# Patient Record
Sex: Male | Born: 1980
Health system: Southern US, Community
[De-identification: ages and names within clinical notes are randomized; demographics above are authoritative.]

---

## 2009-05-16 ENCOUNTER — Ambulatory Visit: Payer: Self-pay | Admitting: Internal Medicine

## 2009-05-25 ENCOUNTER — Encounter: Admission: RE | Admit: 2009-05-25 | Discharge: 2009-05-25 | Payer: Self-pay | Admitting: Internal Medicine

## 2012-09-08 ENCOUNTER — Other Ambulatory Visit: Payer: Self-pay | Admitting: Family Medicine

## 2012-09-08 DIAGNOSIS — IMO0002 Reserved for concepts with insufficient information to code with codable children: Secondary | ICD-10-CM

## 2012-09-08 DIAGNOSIS — G8929 Other chronic pain: Secondary | ICD-10-CM

## 2012-09-15 ENCOUNTER — Ambulatory Visit
Admission: RE | Admit: 2012-09-15 | Discharge: 2012-09-15 | Disposition: A | Discharge status: Home / Self Care | Payer: BC Managed Care – PPO | Source: Ambulatory Visit | Attending: Family Medicine | Admitting: Family Medicine

## 2012-09-15 DIAGNOSIS — IMO0002 Reserved for concepts with insufficient information to code with codable children: Secondary | ICD-10-CM

## 2012-09-15 DIAGNOSIS — G8929 Other chronic pain: Secondary | ICD-10-CM

## 2012-09-15 MED ORDER — GADOBENATE DIMEGLUMINE 529 MG/ML IV SOLN
17.0000 mL | Freq: Once | INTRAVENOUS | Status: AC | PRN
  Administered 2012-09-15: 17 mL via INTRAVENOUS

## 2013-09-26 ENCOUNTER — Ambulatory Visit
Admission: RE | Admit: 2013-09-26 | Discharge: 2013-09-26 | Disposition: A | Discharge status: Home / Self Care | Payer: BC Managed Care – PPO | Source: Ambulatory Visit | Attending: Family Medicine | Admitting: Family Medicine

## 2013-09-26 ENCOUNTER — Other Ambulatory Visit: Payer: Self-pay | Admitting: Family Medicine

## 2013-09-26 DIAGNOSIS — M545 Low back pain, unspecified: Secondary | ICD-10-CM

## 2015-04-18 IMAGING — CR DG LUMBAR SPINE COMPLETE 4+V
5 series · 5 of 5 positions shown · non-contrast
Comparison: None.

CLINICAL DATA: Chronic low back pain without radiculopathy for
years. No acute injury.

EXAM:
LUMBAR SPINE - COMPLETE 4+ VIEW

[view not recorded (1 of 5)]
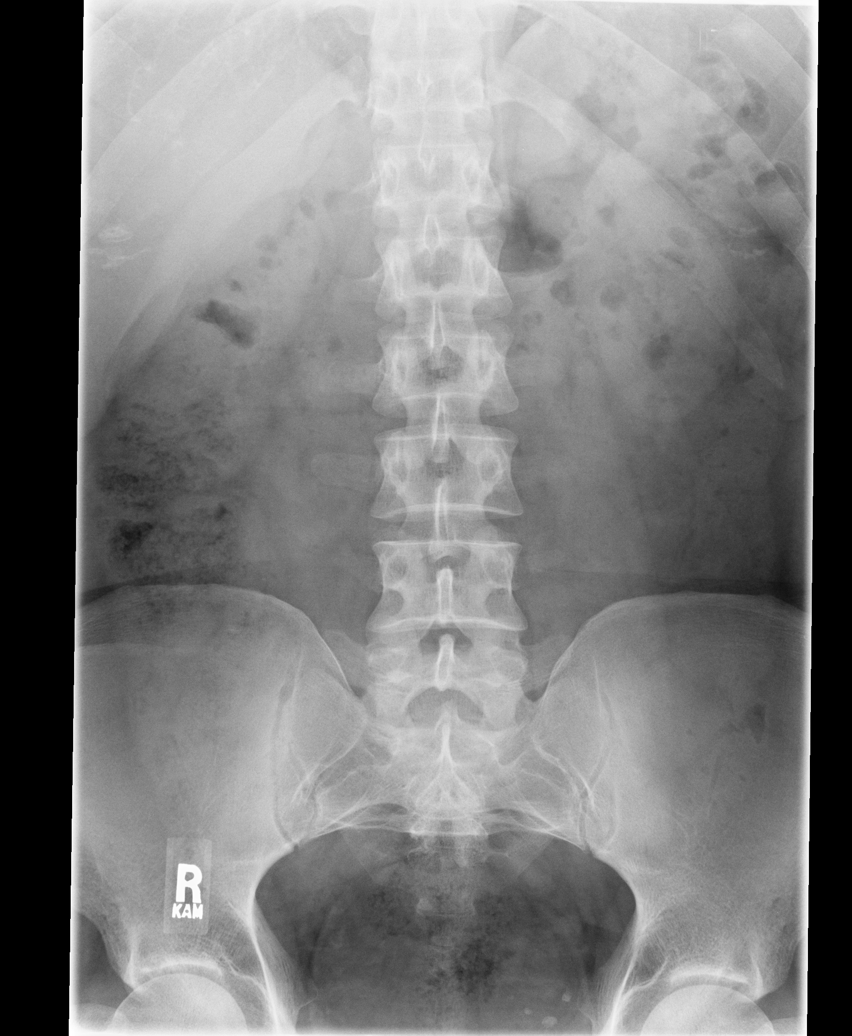

[view not recorded (2 of 5)]
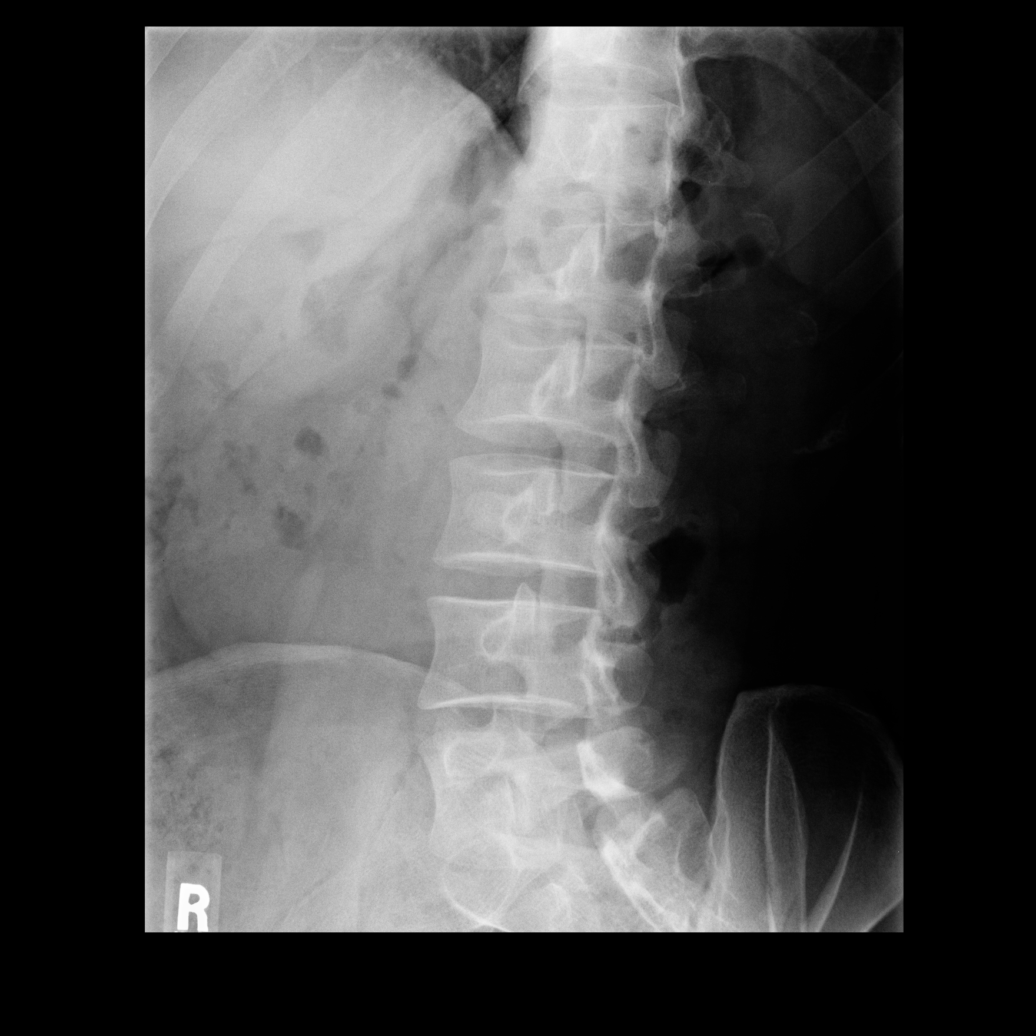

[view not recorded (3 of 5)]
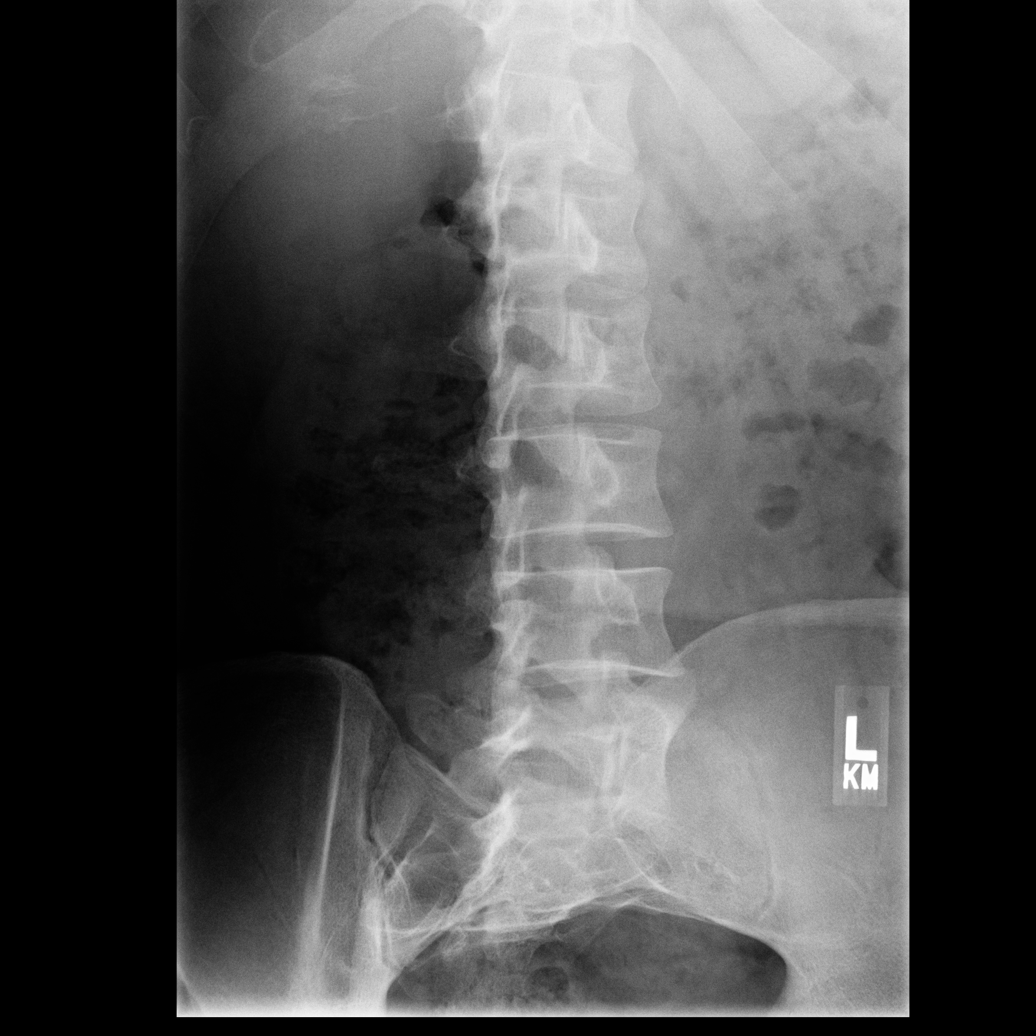

[view not recorded (4 of 5)]
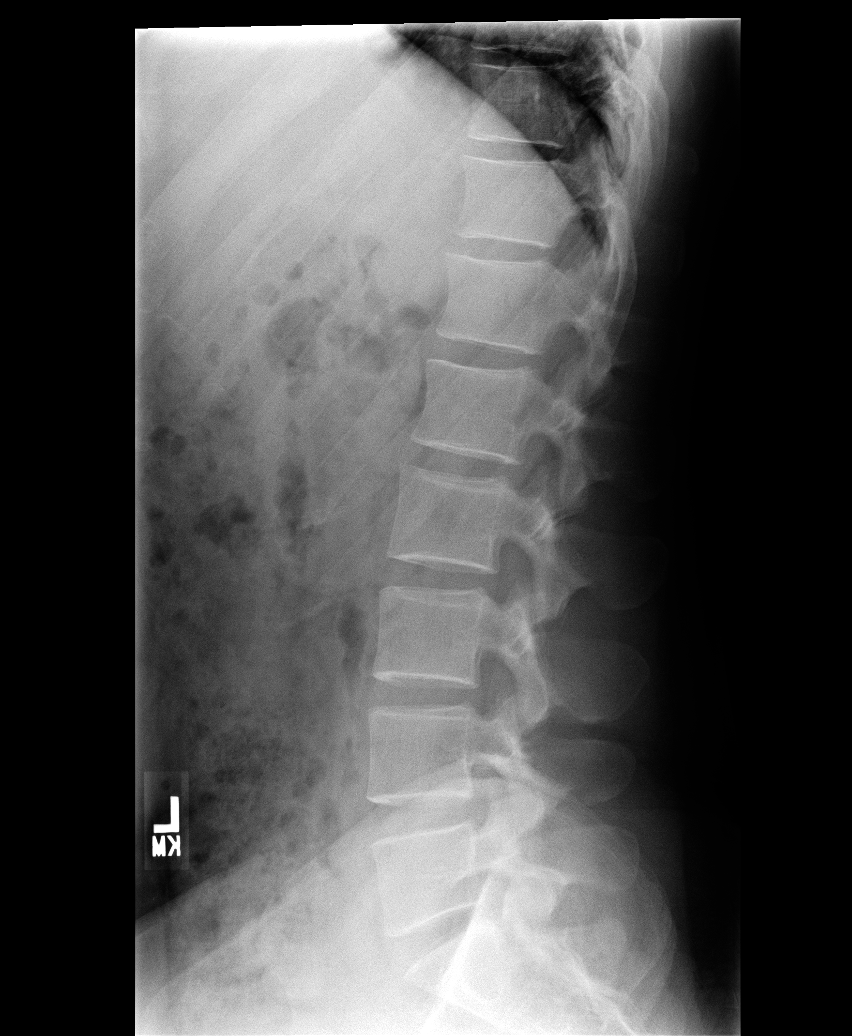

[view not recorded (5 of 5)]
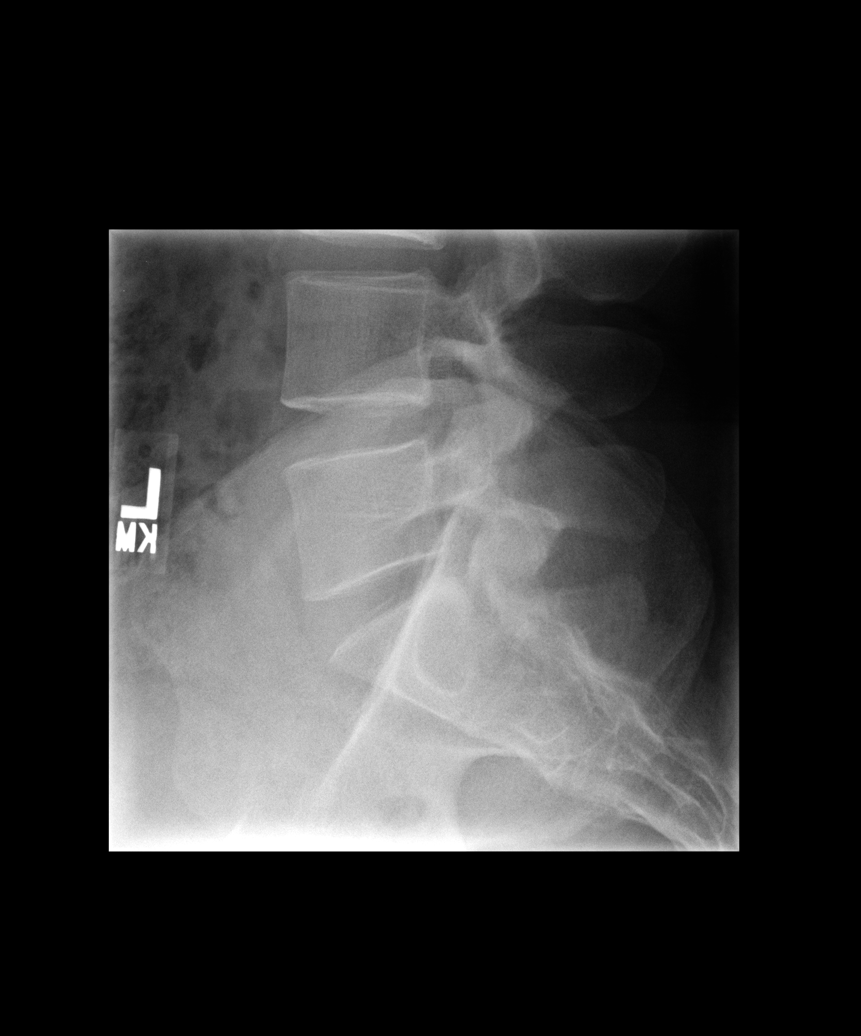

[5 of 5 positions shown; findings below may reference images not displayed]

FINDINGS: There is transitional lumbosacral anatomy. Based on the morphology
of the lowest segment, it is assigned L5. There are no visible ribs
at T12. The alignment is normal. The disc spaces are preserved.
There is no evidence of fracture, pars defect or significant facet
disease. Left pelvic calcifications are likely phleboliths.
IMPRESSION: No acute osseous findings, malalignment or significant spondylosis.
Transitional anatomy.

## 2018-11-22 ENCOUNTER — Other Ambulatory Visit: Payer: Self-pay

## 2018-11-22 ENCOUNTER — Other Ambulatory Visit: Payer: Self-pay | Admitting: Family Medicine

## 2018-11-22 ENCOUNTER — Ambulatory Visit
Admission: RE | Admit: 2018-11-22 | Discharge: 2018-11-22 | Disposition: A | Discharge status: Home / Self Care | Payer: Self-pay | Source: Ambulatory Visit | Attending: Family Medicine | Admitting: Family Medicine

## 2018-11-22 DIAGNOSIS — S6991XA Unspecified injury of right wrist, hand and finger(s), initial encounter: Secondary | ICD-10-CM

## 2018-11-22 DIAGNOSIS — S63282A Dislocation of proximal interphalangeal joint of right middle finger, initial encounter: Secondary | ICD-10-CM | POA: Diagnosis not present

## 2018-11-23 DIAGNOSIS — M79641 Pain in right hand: Secondary | ICD-10-CM | POA: Diagnosis not present

## 2018-11-23 DIAGNOSIS — S63272A Dislocation of unspecified interphalangeal joint of right middle finger, initial encounter: Secondary | ICD-10-CM | POA: Diagnosis not present

## 2018-11-25 DIAGNOSIS — M79641 Pain in right hand: Secondary | ICD-10-CM | POA: Diagnosis not present

## 2018-11-30 DIAGNOSIS — S63272D Dislocation of unspecified interphalangeal joint of right middle finger, subsequent encounter: Secondary | ICD-10-CM | POA: Diagnosis not present

## 2018-11-30 DIAGNOSIS — Z4789 Encounter for other orthopedic aftercare: Secondary | ICD-10-CM | POA: Diagnosis not present

## 2018-11-30 DIAGNOSIS — M79641 Pain in right hand: Secondary | ICD-10-CM | POA: Diagnosis not present

## 2018-12-06 DIAGNOSIS — M79641 Pain in right hand: Secondary | ICD-10-CM | POA: Diagnosis not present

## 2018-12-07 DIAGNOSIS — S63272D Dislocation of unspecified interphalangeal joint of right middle finger, subsequent encounter: Secondary | ICD-10-CM | POA: Diagnosis not present

## 2018-12-07 DIAGNOSIS — M79641 Pain in right hand: Secondary | ICD-10-CM | POA: Diagnosis not present

## 2018-12-07 DIAGNOSIS — Z4789 Encounter for other orthopedic aftercare: Secondary | ICD-10-CM | POA: Diagnosis not present

## 2018-12-14 DIAGNOSIS — Z4789 Encounter for other orthopedic aftercare: Secondary | ICD-10-CM | POA: Diagnosis not present

## 2018-12-14 DIAGNOSIS — M79641 Pain in right hand: Secondary | ICD-10-CM | POA: Diagnosis not present

## 2018-12-14 DIAGNOSIS — S63272D Dislocation of unspecified interphalangeal joint of right middle finger, subsequent encounter: Secondary | ICD-10-CM | POA: Diagnosis not present

## 2018-12-29 DIAGNOSIS — M79641 Pain in right hand: Secondary | ICD-10-CM | POA: Diagnosis not present

## 2019-01-04 DIAGNOSIS — M79641 Pain in right hand: Secondary | ICD-10-CM | POA: Diagnosis not present

## 2019-01-04 DIAGNOSIS — S63272D Dislocation of unspecified interphalangeal joint of right middle finger, subsequent encounter: Secondary | ICD-10-CM | POA: Diagnosis not present

## 2019-01-04 DIAGNOSIS — Z5189 Encounter for other specified aftercare: Secondary | ICD-10-CM | POA: Diagnosis not present

## 2020-03-19 ENCOUNTER — Ambulatory Visit: Payer: Self-pay | Attending: Internal Medicine

## 2020-03-19 DIAGNOSIS — Z23 Encounter for immunization: Secondary | ICD-10-CM

## 2020-03-19 NOTE — Progress Notes (Signed)
   [facility]  Name:  Carl Ford    MRN: [id number] DOB: [birth year]  03/19/2020  Carl Ford was observed post Covid-19 immunization for 15 minutes without incident. He was provided with Vaccine Information Sheet and instruction to access the V-Safe system.   Carl Ford was instructed to call 911 with any severe reactions post vaccine: Marland Kitchen Difficulty breathing  . Swelling of face and throat  . A fast heartbeat  . A bad rash all over body  . Dizziness and weakness   Immunizations Administered    Name Date Dose VIS Date Route   Pfizer COVID-19 Vaccine 03/19/2020  2:20 PM 0.3 mL 01/31/2020 Intramuscular   Manufacturer: [employer], [employer]   Lot: [id number]   NDC: [address]-8
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
## 2022-07-17 ENCOUNTER — Other Ambulatory Visit (HOSPITAL_BASED_OUTPATIENT_CLINIC_OR_DEPARTMENT_OTHER): Payer: Self-pay | Admitting: Family Medicine

## 2022-07-17 DIAGNOSIS — E782 Mixed hyperlipidemia: Secondary | ICD-10-CM

## 2022-09-08 ENCOUNTER — Encounter (HOSPITAL_BASED_OUTPATIENT_CLINIC_OR_DEPARTMENT_OTHER): Payer: Self-pay

## 2022-09-08 ENCOUNTER — Ambulatory Visit (HOSPITAL_BASED_OUTPATIENT_CLINIC_OR_DEPARTMENT_OTHER)
Admission: RE | Admit: 2022-09-08 | Discharge: 2022-09-08 | Disposition: A | Discharge status: Home / Self Care | Payer: Self-pay | Source: Ambulatory Visit | Attending: Family Medicine | Admitting: Family Medicine

## 2022-09-08 DIAGNOSIS — E782 Mixed hyperlipidemia: Secondary | ICD-10-CM | POA: Insufficient documentation
